# Patient Record
Sex: Female | Born: 1980 | Hispanic: Yes | Marital: Single | Smoking: Never smoker
Health system: Southern US, Community
[De-identification: ages and names within clinical notes are randomized; demographics above are authoritative.]

## PROBLEM LIST (undated history)

## (undated) DIAGNOSIS — N83209 Unspecified ovarian cyst, unspecified side: Secondary | ICD-10-CM

## (undated) DIAGNOSIS — R519 Headache, unspecified: Secondary | ICD-10-CM

## (undated) DIAGNOSIS — R51 Headache: Secondary | ICD-10-CM

---

## 2016-02-28 DIAGNOSIS — M549 Dorsalgia, unspecified: Secondary | ICD-10-CM | POA: Insufficient documentation

## 2016-02-28 DIAGNOSIS — G5691 Unspecified mononeuropathy of right upper limb: Secondary | ICD-10-CM | POA: Insufficient documentation

## 2016-02-28 DIAGNOSIS — Q279 Congenital malformation of peripheral vascular system, unspecified: Secondary | ICD-10-CM | POA: Insufficient documentation

## 2016-05-01 ENCOUNTER — Encounter (HOSPITAL_BASED_OUTPATIENT_CLINIC_OR_DEPARTMENT_OTHER): Payer: Self-pay

## 2016-05-01 ENCOUNTER — Emergency Department (HOSPITAL_BASED_OUTPATIENT_CLINIC_OR_DEPARTMENT_OTHER)
Admission: EM | Admit: 2016-05-01 | Discharge: 2016-05-01 | Disposition: A | Discharge status: Home / Self Care | Payer: Medicaid Other | Attending: Emergency Medicine | Admitting: Emergency Medicine

## 2016-05-01 DIAGNOSIS — M542 Cervicalgia: Secondary | ICD-10-CM | POA: Diagnosis present

## 2016-05-01 HISTORY — DX: Headache: R51

## 2016-05-01 HISTORY — DX: Headache, unspecified: R51.9

## 2016-05-01 HISTORY — DX: Unspecified ovarian cyst, unspecified side: N83.209

## 2016-05-01 MED ORDER — ONDANSETRON 8 MG PO TBDP: 8.0000 mg | ORAL_TABLET | Freq: Three times a day (TID) | ORAL | 0 refills | Status: AC | PRN

## 2016-05-01 MED ORDER — HYDROCODONE-ACETAMINOPHEN 5-325 MG PO TABS: 1.0000 | ORAL_TABLET | Freq: Four times a day (QID) | ORAL | 0 refills | Status: DC | PRN

## 2016-05-01 MED ORDER — HYDROCODONE-ACETAMINOPHEN 5-325 MG PO TABS
1.0000 | ORAL_TABLET | Freq: Once | ORAL | Status: AC
  Administered 2016-05-01: 1 via ORAL
  Filled 2016-05-01: qty 1

## 2016-05-01 MED ORDER — ONDANSETRON 8 MG PO TBDP
8.0000 mg | ORAL_TABLET | Freq: Once | ORAL | Status: AC
  Administered 2016-05-01: 8 mg via ORAL
  Filled 2016-05-01: qty 1

## 2016-05-01 NOTE — ED Triage Notes (Addendum)
Pt c/o pain to the base of her neck that increases with movement, earlier it caused nausea and the pain woke her from sleep.  Pt has a hx of headaches, states that now she is having pressure and burning that radiates to both sides of her neck.

## 2016-05-01 NOTE — ED Notes (Signed)
Placed ice pack on back of patient's neck and gave her a warm blanket while her medication starts working.

## 2016-05-01 NOTE — ED Provider Notes (Addendum)
[facility] Provider Note: Lowella DellJ. Lane Shellie Rogoff, MD, FACEP  CSN: [id number] MRN: [id number] ARRIVAL: 05/01/16 at 0335 ROOM: [facility]   CHIEF COMPLAINT  Neck Pain   HISTORY OF PRESENT ILLNESS  Katie BarretteFelice Payne is a 36 y.o. female that the gradual onset of neck pain beginning about 3 PM yesterday afternoon. She denies trauma or other injury. She describes the pain as a burning pain that begins at the top of her C-spine and radiates down to her trapezius muscles bilaterally and to her left deltoid and left upper back. She had some paresthesias in about the C8 dermatome bilaterally earlier but these have resolved. She denies numbness or weakness presently. She rates her pain as an 8 out of 10. She is able to move her neck but movement of her neck reproduces her pain. She is having difficulty localizing its origin. She has not had a fever or chills. She has had nausea in proportion to the pain she has been having.   Past Medical History:  Diagnosis Date  . Headache   . Ovarian cyst     History reviewed. No pertinent surgical history.  No family history on file.  Social History  Substance Use Topics  . Smoking status: Never Smoker  . Smokeless tobacco: Never Used  . Alcohol use Yes     Comment: occasionally     Prior to Admission medications   Medication Sig Start Date End Date Taking? Authorizing Provider  HYDROcodone-acetaminophen (NORCO) 5-325 MG tablet Take 1-2 tablets by mouth every 6 (six) hours as needed (for pain; may cause constipation). 05/01/16   Dayan Desa, MD  ondansetron (ZOFRAN ODT) 8 MG disintegrating tablet Take 1 tablet (8 mg total) by mouth every 8 (eight) hours as needed for nausea or vomiting. 05/01/16   Paula LibraJohn Mertis Mosher, MD    Allergies Patient has no known allergies.   REVIEW OF SYSTEMS  Negative except as noted here or in the History of Present Illness.   PHYSICAL EXAMINATION  Initial Vital Signs Blood pressure 109/82, pulse 78, temperature 97.9 F (36.6  C), temperature source Oral, resp. rate 18, height 5' 3.5" (1.613 m), weight 130 lb (59 kg), last menstrual period 04/26/2016, SpO2 100 %.  Examination General: Well-developed, well-nourished female in no acute distress; appearance consistent with age of record HENT: normocephalic; atraumatic Eyes: pupils equal, round and reactive to light; extraocular muscles intact Neck: Patient able to move neck but movement reproduces pain; neck movements deliberately slow; bilateral posterior soft tissue tenderness without definite muscle spasm; left upper paraspinal tenderness, bilateral trapezius tenderness and tenderness over the left deltoid; no lymphadenopathy Heart: regular rate and rhythm Lungs: clear to auscultation bilaterally Abdomen: soft; nondistended; nontender; no masses or hepatosplenomegaly; bowel sounds present Extremities: No deformity; full range of motion except shoulders due to pain; pulses normal Neurologic: Awake, alert and oriented; motor function intact in all extremities and symmetric; no facial droop; sensation intact in upper extremities and symmetric Skin: Warm and dry Psychiatric: Flat affect   RESULTS  Summary of this visit's results, reviewed by myself:   EKG Interpretation  Date/Time:    Ventricular Rate:    PR Interval:    QRS Duration:   QT Interval:    QTC Calculation:   R Axis:     Text Interpretation:        Laboratory Studies: No results found for this or any previous visit (from the past 24 hour(s)). Imaging Studies: No results found.  ED COURSE  Nursing notes and initial  vitals signs, including pulse oximetry, reviewed.  Vitals:   05/01/16 0343  BP: 109/82  Pulse: 78  Resp: 18  Temp: 97.9 F (36.6 C)  TempSrc: Oral  SpO2: 100%  Weight: 130 lb (59 kg)  Height: 5' 3.5" (1.613 m)   The patient is not exhibiting any signs or symptoms to suggest meningitis. I believe this is likely musculoskeletal or disc related. Its diffuseness leans more  towards musculoskeletal than a focal disc herniation. She was advised that an MRI may be necessary if symptoms persist or worsen. We will treat her symptomatically in the meantime.  Consultation with the [facility] state controlled substances database reveals the patient has received no narcotic prescriptions in the past year.   PROCEDURES    ED DIAGNOSES     ICD-9-CM ICD-10-CM   1. Neck pain 723.1 M54.2        Paula Libra, MD 05/01/16 1027    Paula Libra, MD 05/01/16 [phone]

## 2016-05-01 NOTE — ED Notes (Signed)
Pt and husband verbalize understanding of dc instructions.  Pt has a second appointment with her ortho doctor on Monday and is also advised to try ice or heat with her medications to help with the pain.  They deny any further needs at this time.

## 2016-05-05 ENCOUNTER — Ambulatory Visit (HOSPITAL_BASED_OUTPATIENT_CLINIC_OR_DEPARTMENT_OTHER)
Admission: RE | Admit: 2016-05-05 | Discharge: 2016-05-05 | Disposition: A | Discharge status: Home / Self Care | Payer: Medicaid Other | Source: Ambulatory Visit | Attending: Family Medicine | Admitting: Family Medicine

## 2016-05-05 ENCOUNTER — Encounter: Payer: Self-pay | Admitting: Family Medicine

## 2016-05-05 ENCOUNTER — Ambulatory Visit (INDEPENDENT_AMBULATORY_CARE_PROVIDER_SITE_OTHER): Payer: Medicaid Other | Admitting: Family Medicine

## 2016-05-05 VITALS — BP 106/74 | HR 77 | Ht 64.0 in | Wt 130.0 lb

## 2016-05-05 DIAGNOSIS — M542 Cervicalgia: Secondary | ICD-10-CM | POA: Diagnosis not present

## 2016-05-05 DIAGNOSIS — M549 Dorsalgia, unspecified: Secondary | ICD-10-CM

## 2016-05-05 MED ORDER — PREDNISONE 10 MG PO TABS: ORAL_TABLET | ORAL | 0 refills | Status: AC

## 2016-05-05 MED ORDER — HYDROCODONE-ACETAMINOPHEN 5-325 MG PO TABS: 1.0000 | ORAL_TABLET | Freq: Four times a day (QID) | ORAL | 0 refills | Status: AC | PRN

## 2016-05-05 NOTE — Patient Instructions (Signed)
Get x-rays downstairs as you leave today (of cervical spine, thoracic spine, chest). We will touch base about next steps (possible CT, MRI, etc) depending on those results. Take prednisone dose pack as directed for 6 days. Hydrocodone as needed for severe pain.

## 2016-05-06 DIAGNOSIS — M542 Cervicalgia: Secondary | ICD-10-CM | POA: Insufficient documentation

## 2016-05-06 NOTE — Assessment & Plan Note (Signed)
Independently reviewed radiographs of chest, thoracic, and cervical spines - no evidence fractures, mass, hilar lymphadenopathy, other concerning abnormalities.  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  She's had progressively worsening symptoms over past year.  She will start with prednisone and we will order cervical spine MRI though discussed may be denied as this is her first visit with us.  Hydrocodone as needed for severe pain.

## 2016-05-06 NOTE — Progress Notes (Signed)
PCP: Christ KickErvin,Alison M, PA  Subjective:   HPI: Patient is a 36 y.o. female here for back pain.  Patient reports she's had worsening upper back pain primarily over past year. Pain starts mainly left side and middle of thoracic area and can get up to 8/10 and sharp. Severity and frequency worsened over past 3 months especially. Worse if sleeping on back. Has had numbness into both arms and 4th and 5th digits, progressed to include both hands. Taking a deep breath can cause a painful pop that she feels anterior to posterior in her chest. Difficulty swallowing at times because gets pain on left side of neck. Also with headaches into left side of head with this pain. No bowel/bladder dysfunction. No nausea, vomiting, cough, diaphoresis (though feels hot at times at home), other medical problems.  Past Medical History:  Diagnosis Date  . Headache   . Ovarian cyst     Current Outpatient Prescriptions on File Prior to Visit  Medication Sig Dispense Refill  . ondansetron (ZOFRAN ODT) 8 MG disintegrating tablet Take 1 tablet (8 mg total) by mouth every 8 (eight) hours as needed for nausea or vomiting. 10 tablet 0   No current facility-administered medications on file prior to visit.     No past surgical history on file.  No Known Allergies  Social History   Social History  . Marital status: Significant Other    Spouse name: N/A  . Number of children: N/A  . Years of education: N/A   Occupational History  . Not on file.   Social History Main Topics  . Smoking status: Never Smoker  . Smokeless tobacco: Never Used  . Alcohol use Yes     Comment: occasionally   . Drug use: Unknown  . Sexual activity: Not on file   Other Topics Concern  . Not on file   Social History Narrative  . No narrative on file    No family history on file.  BP 106/74   Pulse 77   Ht 5\' 4"  (1.626 m)   Wt 130 lb (59 kg)   LMP 04/26/2016   BMI 22.31 kg/m   Review of Systems: See HPI above.    Objective:  Physical Exam:  Gen: NAD, comfortable in exam room  Neck: No gross deformity, swelling, bruising. TTP midline and left thoracic paraspinal region.  No other tenderness. No palpable lymph nodes (anterior, posterior cervical; supraclavicular, epitrochlear) or masses. FROM neck - pain on full flexion and extension. BUE strength 5/5.   Sensation diminished bilateral 5th digits.  Intact elsewhere.   2+ equal reflexes in triceps, biceps, brachioradialis tendons. Negative spurlings.  Chest/Lungs: Symmetric.  CTAB without wheezes rales or rhonchi.  Did have a painful pop when taking one of the deep breaths but difficult to localize.   Assessment & Plan:  1. Neck, chest pain - Independently reviewed radiographs of chest, thoracic, and cervical spines - no evidence fractures, mass, hilar lymphadenopathy, other concerning abnormalities.  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  She's had progressively worsening symptoms over past year.  She will start with prednisone and we will order cervical spine MRI though discussed may be denied as this is her first visit with us.  Hydrocodone as needed for severe pain.

## 2016-05-08 ENCOUNTER — Telehealth: Payer: Self-pay | Admitting: Family Medicine

## 2016-05-08 NOTE — Telephone Encounter (Signed)
Unable to contact patient.    Will try again.

## 2017-01-17 ENCOUNTER — Encounter (HOSPITAL_BASED_OUTPATIENT_CLINIC_OR_DEPARTMENT_OTHER): Payer: Self-pay | Admitting: Emergency Medicine

## 2017-01-17 ENCOUNTER — Emergency Department (HOSPITAL_BASED_OUTPATIENT_CLINIC_OR_DEPARTMENT_OTHER): Payer: Medicaid Other

## 2017-01-17 ENCOUNTER — Emergency Department (HOSPITAL_BASED_OUTPATIENT_CLINIC_OR_DEPARTMENT_OTHER)
Admission: EM | Admit: 2017-01-17 | Discharge: 2017-01-17 | Disposition: A | Discharge status: Home / Self Care | Payer: Medicaid Other | Attending: Emergency Medicine | Admitting: Emergency Medicine

## 2017-01-17 DIAGNOSIS — Z79899 Other long term (current) drug therapy: Secondary | ICD-10-CM | POA: Diagnosis not present

## 2017-01-17 DIAGNOSIS — R0789 Other chest pain: Secondary | ICD-10-CM | POA: Diagnosis present

## 2017-01-17 LAB — BASIC METABOLIC PANEL
Anion gap: 8 (ref 5–15)
BUN: 13 mg/dL (ref 6–20)
CALCIUM: 9.4 mg/dL (ref 8.9–10.3)
CO2: 22 mmol/L (ref 22–32)
CREATININE: 0.66 mg/dL (ref 0.44–1.00)
Chloride: 105 mmol/L (ref 101–111)
GLUCOSE: 89 mg/dL (ref 65–99)
Potassium: 4.1 mmol/L (ref 3.5–5.1)
Sodium: 135 mmol/L (ref 135–145)

## 2017-01-17 LAB — CBC
HCT: 38.4 % (ref 36.0–46.0)
Hemoglobin: 12.8 g/dL (ref 12.0–15.0)
MCH: 30.4 pg (ref 26.0–34.0)
MCHC: 33.3 g/dL (ref 30.0–36.0)
MCV: 91.2 fL (ref 78.0–100.0)
PLATELETS: 262 10*3/uL (ref 150–400)
RBC: 4.21 MIL/uL (ref 3.87–5.11)
RDW: 12.9 % (ref 11.5–15.5)
WBC: 8.9 10*3/uL (ref 4.0–10.5)

## 2017-01-17 LAB — TROPONIN I

## 2017-01-17 MED ORDER — ASPIRIN EC 325 MG PO TBEC
325.0000 mg | DELAYED_RELEASE_TABLET | Freq: Once | ORAL | Status: AC
Start: 2017-01-17 — End: 2017-01-17
  Administered 2017-01-17: 325 mg via ORAL
  Filled 2017-01-17: qty 1

## 2017-01-17 NOTE — ED Provider Notes (Signed)
[facility] EMERGENCY DEPARTMENT Provider Note   CSN: [id number] Arrival date & time: 01/17/17  1329     History   Chief Complaint Chief Complaint  Patient presents with  . Chest Pain    HPI Katie Payne is a 36 y.o. female.  HPI   36 year old female presents today with complaints of chest pain.  Patient reports approximately hour prior to arrival in the ED while driving she developed a squeezing sensation in her left chest.  She notes this comes and goes in waves, not associated with shortness of breath, diaphoresis, radiation symptoms.  Patient denies any history of the same.  She denies any cardiac history, and history of DVT or PE, or any other significant risk factors for either.  She is a non-smoker.  No swelling or edema of the lower extremities.   Past Medical History:  Diagnosis Date  . Headache   . Ovarian cyst     Patient Active Problem List   Diagnosis Date Noted  . Neck pain 05/06/2016  . Back pain 02/28/2016  . 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 No pertinent surgical history.  OB History    No data available       Home Medications    Prior to Admission medications   Medication Sig Start Date End Date Taking? Authorizing Provider  DULoxetine (CYMBALTA) 30 MG capsule TK ONE C PO QD FOR 1 WK THEN INCREASE TO 1 C BID 04/04/16   [provider]  HYDROcodone-acetaminophen (NORCO) 5-325 MG tablet Take 1 tablet by mouth every 6 (six) hours as needed (for pain; may cause constipation). 05/05/16   Hudnall, Azucena FallenShane R, MD  ondansetron (ZOFRAN ODT) 8 MG disintegrating tablet Take 1 tablet (8 mg total) by mouth every 8 (eight) hours as needed for nausea or vomiting. 05/01/16   Molpus, Jonny RuizJohn, MD  predniSONE (DELTASONE) 10 MG tablet 6 tabs po day 1, 5 tabs po day 2, 4 tabs po day 3, 3 tabs po day 4, 2 tabs po day 5, 1 tab po day 6 05/05/16   Hudnall, Azucena FallenShane R, MD    Family History No  family history on file.  Social History Social History  Substance Use Topics  . Smoking status: Never Smoker  . Smokeless tobacco: Never Used  . Alcohol use Yes     Comment: occasionally      Allergies   Patient has no known allergies.   Review of Systems Review of Systems  All other systems reviewed and are negative.    Physical Exam Updated Vital Signs BP 104/72 (BP Location: Left Arm)   Pulse 71   Temp 98.2 F (36.8 C) (Oral)   Resp 12   Ht 5\' 3"  (1.6 m)   Wt 56.7 kg (125 lb)   LMP 12/28/2016   SpO2 100%   BMI 22.14 kg/m   Physical Exam  Constitutional: She is oriented to person, place, and time. She appears well-developed and well-nourished.  HENT:  Head: Normocephalic and atraumatic.  Eyes: Pupils are equal, round, and reactive to light. Conjunctivae are normal. Right eye exhibits no discharge. Left eye exhibits no discharge. No scleral icterus.  Neck: Normal range of motion. No JVD present. No tracheal deviation present.  Cardiovascular: Normal rate, regular rhythm, normal heart sounds and intact distal pulses.  Exam reveals no gallop and no friction rub.   No murmur heard. Pulmonary/Chest: Effort normal and breath sounds normal. No stridor. No respiratory  distress. She has no wheezes. She has no rales. She exhibits no tenderness.  Abdominal: Soft. She exhibits no distension and no mass. There is no tenderness. There is no rebound and no guarding.  Neurological: She is alert and oriented to person, place, and time. Coordination normal.  Psychiatric: She has a normal mood and affect. Her behavior is normal. Judgment and thought content normal.  Nursing note and vitals reviewed.   ED Treatments / Results  Labs (all labs ordered are listed, but only abnormal results are displayed) Labs Reviewed  BASIC METABOLIC PANEL  CBC  TROPONIN I    EKG  EKG Interpretation  Date/Time:  Saturday January 17 2017 13:45:33 EDT Ventricular Rate:  70 PR  Interval:  144 QRS Duration: 72 QT Interval:  374 QTC Calculation: 403 R Axis:   79 Text Interpretation:  Normal sinus rhythm Normal ECG Confirmed by Jacalyn Lefevre [phone]) on 01/17/2017 3:26:09 PM       Radiology Dg Chest 2 View  Result Date: 01/17/2017 CLINICAL DATA:  Acute onset chest pain approximately 20 minutes ago. EXAM: CHEST  2 VIEW COMPARISON:  05/05/2016 FINDINGS: The heart size and mediastinal contours are within normal limits. Both lungs are clear. The visualized skeletal structures are unremarkable. IMPRESSION: Negative.  No active cardiopulmonary disease. Electronically Signed   By: Myles Rosenthal M.D.   On: 01/17/2017 14:29    Procedures Procedures (including critical care time)  Medications Ordered in ED Medications  aspirin EC tablet 325 mg (325 mg Oral Given 01/17/17 1505)     Initial Impression / Assessment and Plan / ED Course  I have reviewed the triage vital signs and the nursing notes.  Pertinent labs & imaging results that were available during my care of the patient were reviewed by me and considered in my medical decision making (see chart for details).      Final Clinical Impressions(s) / ED Diagnoses   Final diagnoses:  Chest wall pain    Labs: I-STAT troponin, BMP, CBC  Imaging: DG Chest 2 View  Consults:  Therapeutics: Aspirin  Discharge Meds:   Assessment/Plan: 36 year old female presents today with complaints of chest pain.  This is coming and going in waves.  I have very low suspicion for ACS, PE, dissection or any other life-threatening etiology in this patient.  She has a low Wells and PERC negative, ACS of 0, otherwise well-appearing.  Patient will follow-up as an outpatient with primary care and cardiology, return precautions given.  She verbalized understanding and agreement to today's plan had no further questions or concerns at time of discharge.    New Prescriptions Discharge Medication List as of 01/17/2017  4:05 PM        Eyvonne Mechanic, PA-C 01/17/17 1626    Jacalyn Lefevre, MD 01/24/17 [phone]

## 2017-01-17 NOTE — ED Notes (Signed)
Patient is A & O x4.  She understands AVS instructions.  No questions or concerns.

## 2017-01-17 NOTE — ED Triage Notes (Signed)
PT presents with c/o chest pain that started about 20 minutes ago pt states pain is intermittent and goes straight thru to her back.

## 2017-01-17 NOTE — Discharge Instructions (Signed)
Please read attached information. If you experience any new or worsening signs or symptoms please return to the emergency room for evaluation. Please follow-up with your primary care provider or specialist as discussed.  °

## 2017-03-17 ENCOUNTER — Other Ambulatory Visit: Payer: Self-pay | Admitting: Family Medicine

## 2017-03-21 ENCOUNTER — Telehealth (HOSPITAL_BASED_OUTPATIENT_CLINIC_OR_DEPARTMENT_OTHER): Payer: Self-pay | Admitting: Emergency Medicine

## 2017-03-21 NOTE — Telephone Encounter (Signed)
Patient called asking for the referral that she was given while she was here on her last visit. Patient given name and phone number

## 2018-02-10 ENCOUNTER — Emergency Department (HOSPITAL_BASED_OUTPATIENT_CLINIC_OR_DEPARTMENT_OTHER): Payer: Medicaid Other

## 2018-02-10 ENCOUNTER — Encounter (HOSPITAL_BASED_OUTPATIENT_CLINIC_OR_DEPARTMENT_OTHER): Payer: Self-pay | Admitting: *Deleted

## 2018-02-10 ENCOUNTER — Other Ambulatory Visit: Payer: Self-pay

## 2018-02-10 ENCOUNTER — Emergency Department (HOSPITAL_BASED_OUTPATIENT_CLINIC_OR_DEPARTMENT_OTHER)
Admission: EM | Admit: 2018-02-10 | Discharge: 2018-02-10 | Disposition: A | Discharge status: Home / Self Care | Payer: Medicaid Other | Attending: Emergency Medicine | Admitting: Emergency Medicine

## 2018-02-10 DIAGNOSIS — Z79899 Other long term (current) drug therapy: Secondary | ICD-10-CM | POA: Insufficient documentation

## 2018-02-10 DIAGNOSIS — M436 Torticollis: Secondary | ICD-10-CM

## 2018-02-10 DIAGNOSIS — M542 Cervicalgia: Secondary | ICD-10-CM | POA: Diagnosis present

## 2018-02-10 MED ORDER — CYCLOBENZAPRINE HCL 5 MG PO TABS: 5.0000 mg | ORAL_TABLET | Freq: Three times a day (TID) | ORAL | 0 refills | Status: AC | PRN

## 2018-02-10 MED ORDER — METHOCARBAMOL 1000 MG/10ML IJ SOLN
1000.0000 mg | Freq: Once | INTRAMUSCULAR | Status: AC
  Administered 2018-02-10: 1000 mg via INTRAMUSCULAR
  Filled 2018-02-10: qty 10

## 2018-02-10 MED ORDER — KETOROLAC TROMETHAMINE 60 MG/2ML IM SOLN
60.0000 mg | Freq: Once | INTRAMUSCULAR | Status: AC
  Administered 2018-02-10: 60 mg via INTRAMUSCULAR
  Filled 2018-02-10: qty 2

## 2018-02-10 MED ORDER — IBUPROFEN 800 MG PO TABS: 800.0000 mg | ORAL_TABLET | Freq: Three times a day (TID) | ORAL | 0 refills | Status: AC | PRN

## 2018-02-10 NOTE — ED Provider Notes (Signed)
[facility] EMERGENCY DEPARTMENT Provider Note   CSN: [id number] Arrival date & time: 02/10/18  0809     History   Chief Complaint Chief Complaint  Patient presents with  . Neck Pain    HPI Katie Payne is a 37 y.o. female.  The history is provided by the patient and a significant other. No language interpreter was used.  Neck Pain     Katie Payne is a 37 y.o. female who presents to the Emergency Department complaining of neck pain. She presents to the emergency department complaining of acute neck pain. Symptoms began just upon waking at 550 this morning. She has a history of neck and back problems with a spasm about three months ago that was similar. She does not get treatment at that time. Pain is throughout her midline cervical and upper thoracic spine and is described as severe and constant in nature. Pain is worse with range of motion. She reports several years of intermittent tingling to her fingers bilaterally as well as several years of sensation of not being fully able to empty her bladder. No new numbness, weakness, difficulty breathing or swallowing, chest pain, shortness of breath. She does have mild nausea. Past Medical History:  Diagnosis Date  . Headache   . Ovarian cyst     Patient Active Problem List   Diagnosis Date Noted  . Neck pain 05/06/2016  . Back pain 02/28/2016  . 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 No pertinent surgical history.   OB History   None      Home Medications    Prior to Admission medications   Medication Sig Start Date End Date Taking? Authorizing Provider  cyclobenzaprine (FLEXERIL) 5 MG tablet Take 1-2 tablets (5-10 mg total) by mouth 3 (three) times daily as needed for muscle spasms. 02/10/18   Tilden Fossaees, Debrah Granderson, MD  DULoxetine (CYMBALTA) 30 MG capsule TK ONE C PO QD FOR 1 WK THEN INCREASE TO 1 C BID 04/04/16   [provider]    HYDROcodone-acetaminophen (NORCO) 5-325 MG tablet Take 1 tablet by mouth every 6 (six) hours as needed (for pain; may cause constipation). 05/05/16   Hudnall, Azucena FallenShane R, MD  ibuprofen (ADVIL,MOTRIN) 800 MG tablet Take 1 tablet (800 mg total) by mouth every 8 (eight) hours as needed. 02/10/18   Tilden Fossaees, Roshini Fulwider, MD  ondansetron (ZOFRAN ODT) 8 MG disintegrating tablet Take 1 tablet (8 mg total) by mouth every 8 (eight) hours as needed for nausea or vomiting. 05/01/16   Molpus, Jonny RuizJohn, MD  predniSONE (DELTASONE) 10 MG tablet 6 tabs po day 1, 5 tabs po day 2, 4 tabs po day 3, 3 tabs po day 4, 2 tabs po day 5, 1 tab po day 6 05/05/16   Hudnall, Azucena FallenShane R, MD    Family History History reviewed. No pertinent family history.  Social History Social History   Tobacco Use  . Smoking status: Never Smoker  . Smokeless tobacco: Never Used  Substance Use Topics  . Alcohol use: Yes    Comment: occasionally   . Drug use: Not on file     Allergies   Patient has no known allergies.   Review of Systems Review of Systems  Musculoskeletal: Positive for neck pain.  All other systems reviewed and are negative.    Physical Exam Updated Vital Signs BP 117/83   Pulse 89   Temp 98.2 F (36.8 C) (Oral)   Resp 18  LMP 01/27/2018   SpO2 100%   Physical Exam  Constitutional: She is oriented to person, place, and time. She appears well-developed and well-nourished.  HENT:  Head: Normocephalic and atraumatic.  Cardiovascular: Normal rate and regular rhythm.  No murmur heard. Pulmonary/Chest: Effort normal and breath sounds normal. No stridor. No respiratory distress.  Abdominal: Soft. There is no tenderness. There is no rebound and no guarding.  Musculoskeletal: She exhibits no edema.  Diffuse tenderness to palpation throughout the cervical and upper thoracic spine without discrete bony step off. There is no palpable muscle spasm. There is paraspinous tenderness throughout the cervical and thoracic spine.  Decreased range of motion in the neck secondary to pain.  Neurological: She is alert and oriented to person, place, and time.  Five out of five strength in all four extremities with sensation to light touch intact in all four extremities.  Skin: Skin is warm and dry.  Psychiatric: She has a normal mood and affect. Her behavior is normal.  Nursing note and vitals reviewed.    ED Treatments / Results  Labs (all labs ordered are listed, but only abnormal results are displayed) Labs Reviewed - No data to display  EKG None  Radiology Dg Cervical Spine Complete  Result Date: 02/10/2018 CLINICAL DATA:  Cervicalgia EXAM: CERVICAL SPINE - COMPLETE 4+ VIEW COMPARISON:  May 05, 2016 FINDINGS: Frontal, lateral, open-mouth odontoid, and bilateral oblique views were obtained. There is no fracture or spondylolisthesis. Prevertebral soft tissues and predental space regions are normal. The disc spaces appear unremarkable. There is no appreciable exit foraminal narrowing on the oblique views. There is reversal of lordotic curvature. Lung apices are clear. IMPRESSION: Reversal lordotic curvature, a finding probably indicative of chronic muscle spasm. No fracture or spondylolisthesis. No evident arthropathy. Electronically Signed   By: Bretta Bang III M.D.   On: 02/10/2018 10:42    Procedures Procedures (including critical care time)  Medications Ordered in ED Medications  ketorolac (TORADOL) injection 60 mg (60 mg Intramuscular Given 02/10/18 1042)  methocarbamol (ROBAXIN) injection 1,000 mg (1,000 mg Intramuscular Given 02/10/18 1042)     Initial Impression / Assessment and Plan / ED Course  I have reviewed the triage vital signs and the nursing notes.  Pertinent labs & imaging results that were available during my care of the patient were reviewed by me and considered in my medical decision making (see chart for details).     Should here for evaluation of acute onset neck pain. She  is neurovascular intact on examination. Presentation is not consistent with retro pharyngeal abscess, epidural abscess, dissection. Following treatment in the emergency department she is feeling slightly improved. Plan to discharge home with home care for torticollis. Discussed outpatient follow-up and return precautions.  Final Clinical Impressions(s) / ED Diagnoses   Final diagnoses:  Torticollis, acute    ED Discharge Orders         Ordered    cyclobenzaprine (FLEXERIL) 5 MG tablet  3 times daily PRN     02/10/18 1104    ibuprofen (ADVIL,MOTRIN) 800 MG tablet  Every 8 hours PRN     02/10/18 1104           Tilden Fossa, MD 02/10/18 1107

## 2018-02-10 NOTE — ED Triage Notes (Signed)
Pt reports reaching over to turn off her alarm at 550 am and felt a pulling in her neck, has had pain since then.

## 2020-01-11 IMAGING — CR DG CERVICAL SPINE COMPLETE 4+V
5 series · 5 of 5 positions shown · non-contrast
Comparison: May 05, 2016

CLINICAL DATA: Cervicalgia

EXAM:
CERVICAL SPINE - COMPLETE 4+ VIEW

[w c-spine lat]
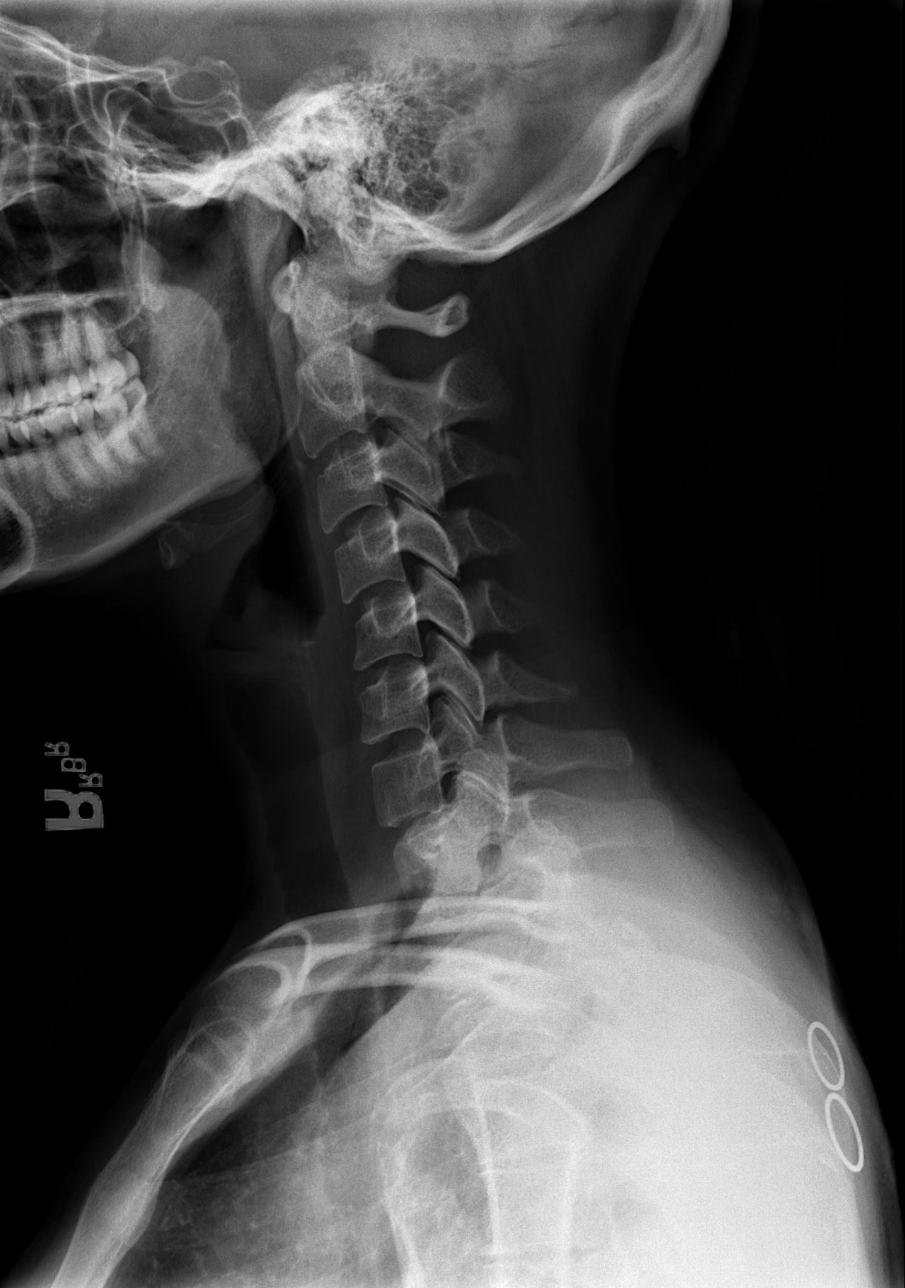

[w c-spine oblique (1 of 2)]
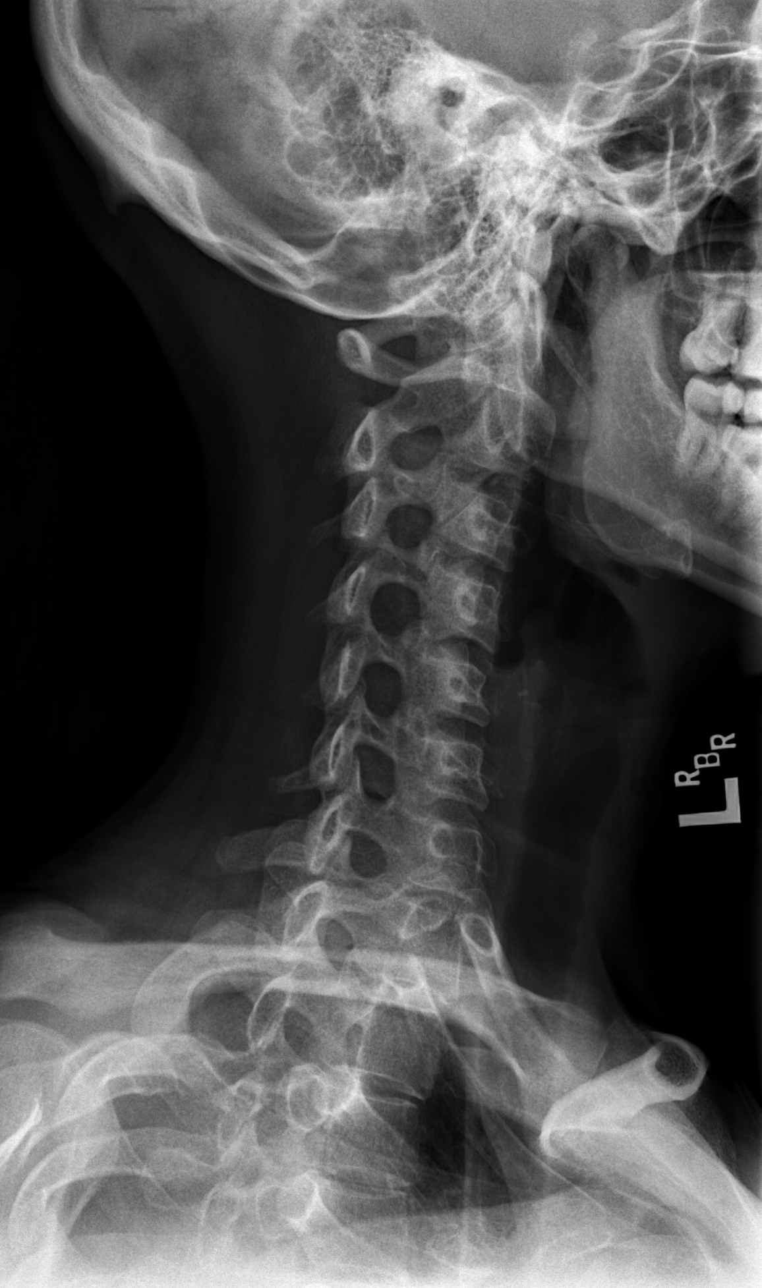

[w c-spine oblique (2 of 2)]
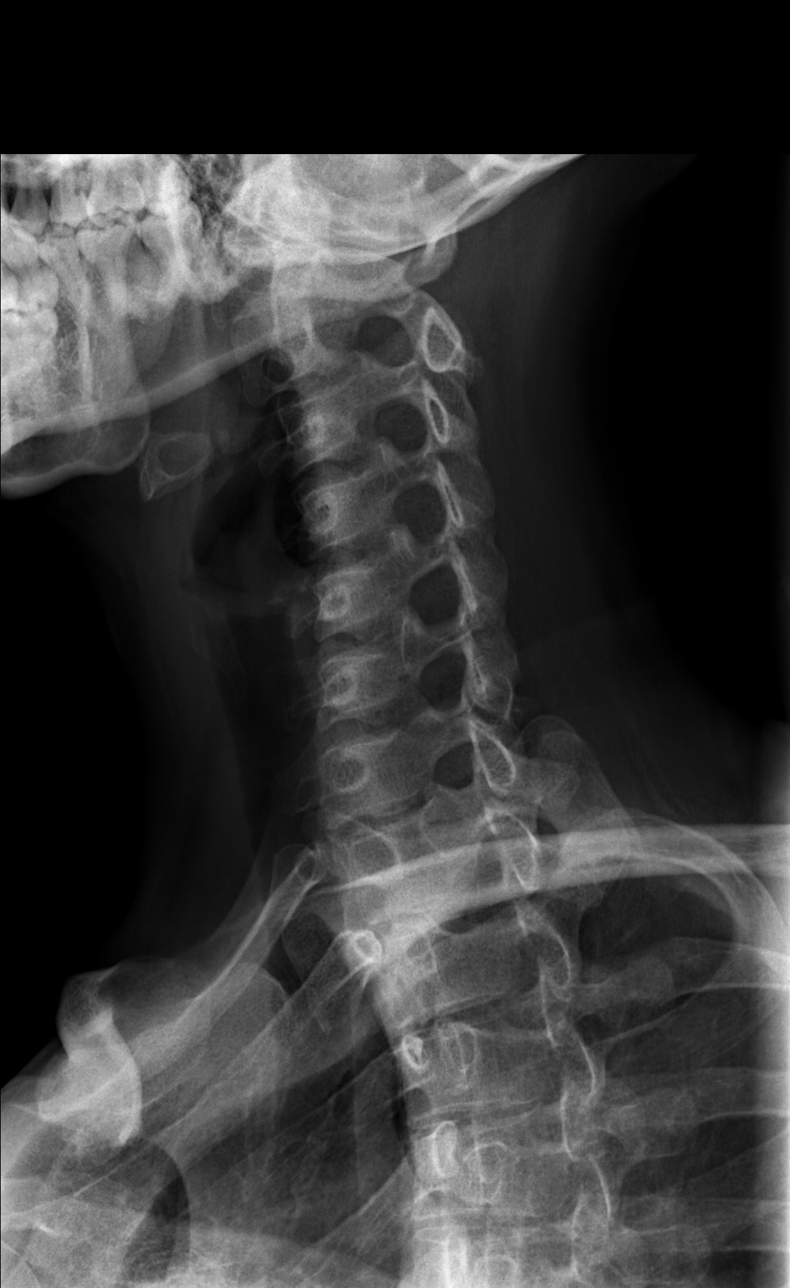

[w c-spine a.p.]
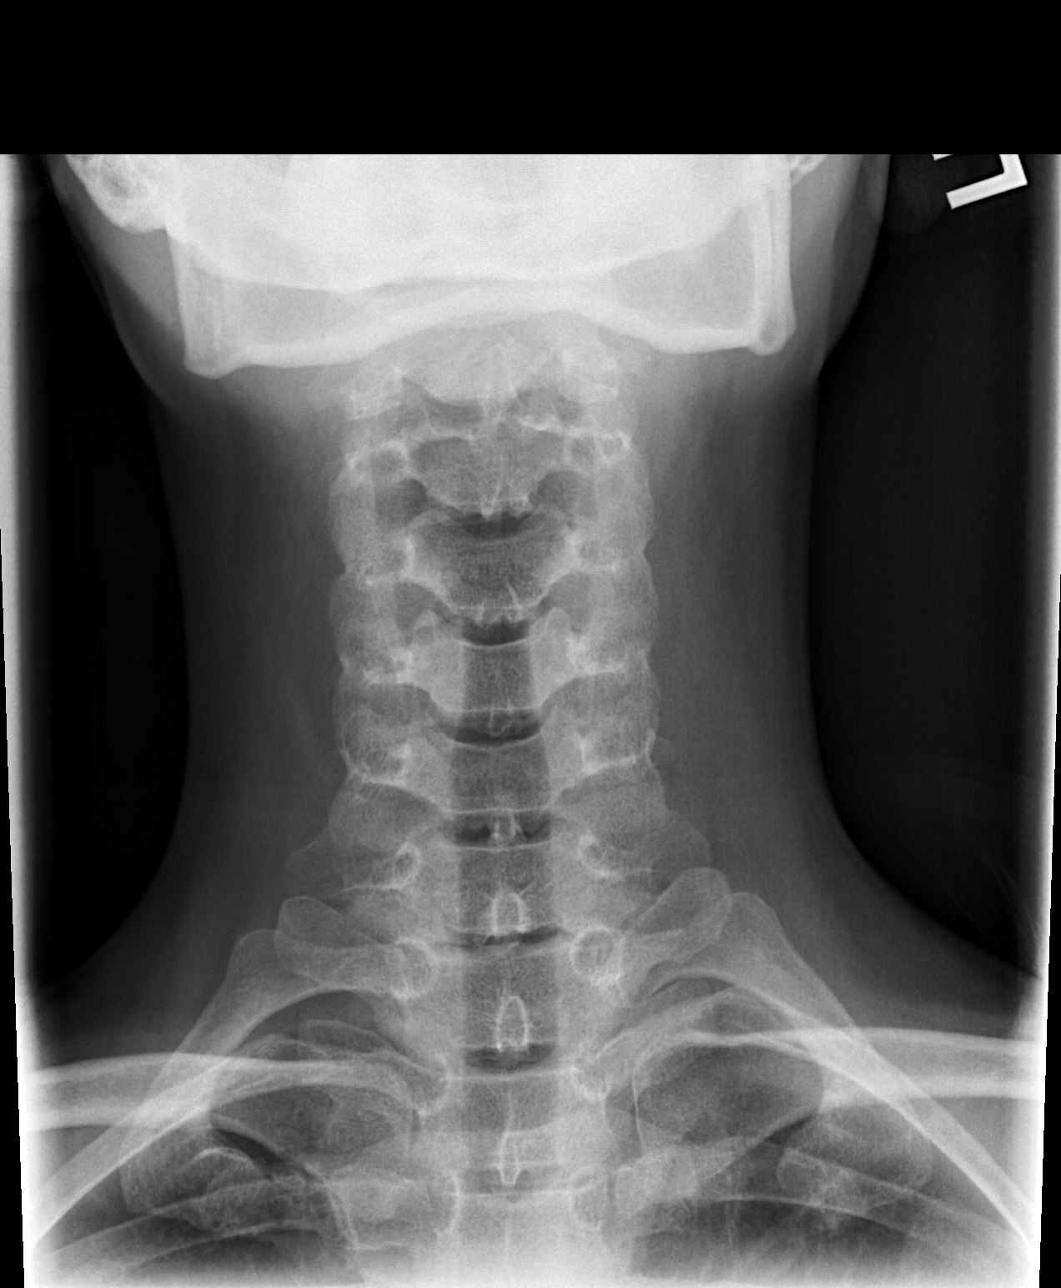

[w c-spine odontoid *]
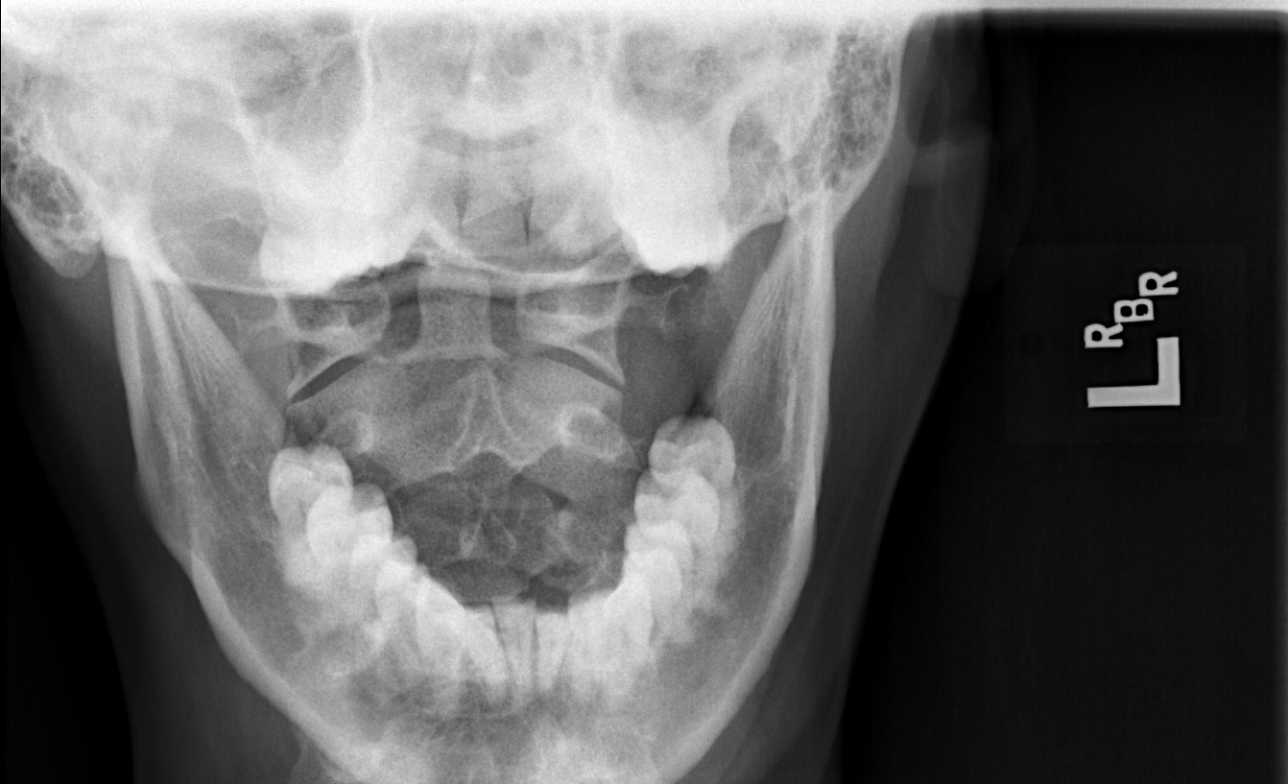

[5 of 5 positions shown; findings below may reference images not displayed]

FINDINGS: Frontal, lateral, open-mouth odontoid, and bilateral oblique views
were obtained. There is no fracture or spondylolisthesis.
Prevertebral soft tissues and predental space regions are normal.
The disc spaces appear unremarkable. There is no appreciable exit
foraminal narrowing on the oblique views. There is reversal of
lordotic curvature. Lung apices are clear.
IMPRESSION: Reversal lordotic curvature, a finding probably indicative of
chronic muscle spasm. No fracture or spondylolisthesis. No evident
arthropathy.

## 2022-10-12 ENCOUNTER — Emergency Department (HOSPITAL_BASED_OUTPATIENT_CLINIC_OR_DEPARTMENT_OTHER)
Admission: EM | Admit: 2022-10-12 | Discharge: 2022-10-13 | Disposition: A | Discharge status: Home / Self Care | Payer: Medicaid Other | Attending: Emergency Medicine | Admitting: Emergency Medicine

## 2022-10-12 ENCOUNTER — Encounter (HOSPITAL_BASED_OUTPATIENT_CLINIC_OR_DEPARTMENT_OTHER): Payer: Self-pay

## 2022-10-12 ENCOUNTER — Other Ambulatory Visit: Payer: Self-pay

## 2022-10-12 DIAGNOSIS — M79671 Pain in right foot: Secondary | ICD-10-CM | POA: Diagnosis not present

## 2022-10-12 DIAGNOSIS — M79652 Pain in left thigh: Secondary | ICD-10-CM | POA: Diagnosis not present

## 2022-10-12 DIAGNOSIS — M79662 Pain in left lower leg: Secondary | ICD-10-CM | POA: Diagnosis not present

## 2022-10-12 DIAGNOSIS — M79605 Pain in left leg: Secondary | ICD-10-CM

## 2022-10-12 LAB — BASIC METABOLIC PANEL
Anion gap: 8 (ref 5–15)
BUN: 14 mg/dL (ref 6–20)
CO2: 21 mmol/L — ABNORMAL LOW (ref 22–32)
Calcium: 8.7 mg/dL — ABNORMAL LOW (ref 8.9–10.3)
Chloride: 105 mmol/L (ref 98–111)
Creatinine, Ser: 0.84 mg/dL (ref 0.44–1.00)
GFR, Estimated: >60 mL/min (ref >60)
Glucose, Bld: 105 mg/dL — ABNORMAL HIGH (ref 70–99)
Potassium: 3.9 mmol/L (ref 3.5–5.1)
Sodium: 134 mmol/L — ABNORMAL LOW (ref 135–145)

## 2022-10-12 LAB — CBC WITH DIFFERENTIAL/PLATELET
Abs Immature Granulocytes: 0.01 10*3/uL (ref 0.00–0.07)
Basophils Absolute: 0 10*3/uL (ref 0.0–0.1)
Basophils Relative: 0 %
Eosinophils Absolute: 0.2 10*3/uL (ref 0.0–0.5)
Eosinophils Relative: 2 %
HCT: 36.6 % (ref 36.0–46.0)
Hemoglobin: 12.5 g/dL (ref 12.0–15.0)
Immature Granulocytes: 0 %
Lymphocytes Relative: 37 %
Lymphs Abs: 3.1 10*3/uL (ref 0.7–4.0)
MCH: 31.9 pg (ref 26.0–34.0)
MCHC: 34.2 g/dL (ref 30.0–36.0)
MCV: 93.4 fL (ref 80.0–100.0)
Monocytes Absolute: 0.5 10*3/uL (ref 0.1–1.0)
Monocytes Relative: 6 %
Neutro Abs: 4.5 10*3/uL (ref 1.7–7.7)
Neutrophils Relative %: 55 %
Platelets: 255 10*3/uL (ref 150–400)
RBC: 3.92 MIL/uL (ref 3.87–5.11)
RDW: 13.1 % (ref 11.5–15.5)
WBC: 8.3 10*3/uL (ref 4.0–10.5)
nRBC: 0 % (ref 0.0–0.2)

## 2022-10-12 LAB — PREGNANCY, URINE: Preg Test, Ur: NEGATIVE

## 2022-10-12 MED ORDER — ENOXAPARIN SODIUM 60 MG/0.6ML IJ SOSY
1.0000 mg/kg | PREFILLED_SYRINGE | Freq: Once | INTRAMUSCULAR | Status: AC
  Administered 2022-10-13: 52.5 mg via SUBCUTANEOUS
  Filled 2022-10-12: qty 0.6

## 2022-10-12 NOTE — ED Provider Notes (Signed)
[facility] Provider Note   CSN: [id number] Arrival date & time: 10/12/22  2246     History  Chief Complaint  Patient presents with   Leg Pain    Katie Payne is a 42 y.o. female.  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  Patient presenting today with complaints of pain in the left calf extending to the left thigh.  This has been present for the past day.  2 days ago, she underwent an arteriogram of her right leg with entry point in the left groin.  Pain started the day after this procedure.  She denies any chest pain or difficulty breathing.  She denies any swelling of the leg.  No injury or trauma.  The history is provided by the patient.       Home Medications Prior to Admission medications   Medication Sig Start Date End Date Taking? Authorizing Provider  cyclobenzaprine (FLEXERIL) 5 MG tablet Take 1-2 tablets (5-10 mg total) by mouth 3 (three) times daily as needed for muscle spasms. 02/10/18   Tilden Fossa, MD  DULoxetine (CYMBALTA) 30 MG capsule TK ONE C PO QD FOR 1 WK THEN INCREASE TO 1 C BID 04/04/16   [provider]  HYDROcodone-acetaminophen (NORCO) 5-325 MG tablet Take 1 tablet by mouth every 6 (six) hours as needed (for pain; may cause constipation). 05/05/16   Hudnall, Azucena Fallen, MD  ibuprofen (ADVIL,MOTRIN) 800 MG tablet Take 1 tablet (800 mg total) by mouth every 8 (eight) hours as needed. 02/10/18   Tilden Fossa, MD  ondansetron (ZOFRAN ODT) 8 MG disintegrating tablet Take 1 tablet (8 mg total) by mouth every 8 (eight) hours as needed for nausea or vomiting. 05/01/16   Molpus, Jonny Ruiz, MD  predniSONE (DELTASONE) 10 MG tablet 6 tabs po day 1, 5 tabs po day 2, 4 tabs po day 3, 3 tabs po day 4, 2 tabs po day 5, 1 tab po day 6 05/05/16   Hudnall, Azucena Fallen, MD      Allergies    Patient has no known allergies.    Review of Systems   Review of Systems  All other  systems reviewed and are negative.   Physical Exam Updated Vital Signs BP 115/81   Pulse 67   Temp 97.7 F (36.5 C)   Resp 18   Wt 52.2 kg   SpO2 96%   BMI 20.37 kg/m  Physical Exam Vitals and nursing note reviewed.  Constitutional:      Appearance: Normal appearance.  Pulmonary:     Effort: Pulmonary effort is normal.  Musculoskeletal:     Comments: The left lower extremity is grossly normal in appearance.  There is no swelling or edema.  There is no calf tenderness.  Denna Haggard' sign is absent.  DP pulses are palpable.  Skin:    General: Skin is warm and dry.  Neurological:     Mental Status: She is alert and oriented to person, place, and time.     ED Results / Procedures / Treatments   Labs (all labs ordered are listed, but only abnormal results are displayed) Labs Reviewed  BASIC METABOLIC PANEL - Abnormal; Notable for the following components:      Result Value   Sodium 134 (*)    CO2 21 (*)    Glucose, Bld 105 (*)    Calcium 8.7 (*)    All other components within normal  limits  CBC WITH DIFFERENTIAL/PLATELET  PREGNANCY, URINE    EKG None  Radiology No results found.  Procedures Procedures    Medications Ordered in ED Medications  enoxaparin (LOVENOX) injection 52.5 mg (has no administration in time range)    ED Course/ Medical Decision Making/ A&P  Patient presenting with complaints of pain in the left calf and thigh as described in the HPI.  This started the day after undergoing an arteriogram of the right leg.  For the procedure, they entered through the left groin.  Patient arrives here afebrile with stable vital signs.  Physical examination of the leg is basically unremarkable.  I see and palpate no abnormalities.  The insertion site appears to be healing well.  There is no swelling or surrounding erythema.  Laboratory studies including CBC, metabolic panel, and pregnancy test all unremarkable.  As there is no ultrasound present at night at this  facility, patient will undergo an ultrasound in the morning.  I have elected to administer Lovenox to bridge the gap until the study has been completed.  Final Clinical Impression(s) / ED Diagnoses Final diagnoses:  None    Rx / DC Orders ED Discharge Orders     None         Geoffery Lyons, MD 10/13/22 0000

## 2022-10-12 NOTE — ED Triage Notes (Signed)
Pt arrives with c/o left leg pain that started today. Per pt, she had an arteriogram performed Friday for the right leg and they entered in her left groin to do the procedure. Pt describes pain as a shooting pain from mid calf to back of knee. Pt denies CP or SOB.

## 2022-10-13 ENCOUNTER — Ambulatory Visit (HOSPITAL_BASED_OUTPATIENT_CLINIC_OR_DEPARTMENT_OTHER)
Admission: RE | Admit: 2022-10-13 | Discharge: 2022-10-13 | Disposition: A | Discharge status: Home / Self Care | Payer: Medicaid Other | Source: Ambulatory Visit | Attending: Emergency Medicine | Admitting: Emergency Medicine

## 2022-10-13 ENCOUNTER — Other Ambulatory Visit (HOSPITAL_BASED_OUTPATIENT_CLINIC_OR_DEPARTMENT_OTHER): Payer: Self-pay | Admitting: Emergency Medicine

## 2022-10-13 DIAGNOSIS — M79605 Pain in left leg: Secondary | ICD-10-CM | POA: Diagnosis not present

## 2022-10-13 NOTE — ED Notes (Signed)
Discharge paperwork reviewed entirely with patient, including follow up care. Pain was under control. The patient received instruction and coaching on their prescriptions, and all follow-up questions were answered.  Pt verbalized understanding as well as all parties involved. No questions or concerns voiced at the time of discharge. No acute distress noted.   Pt ambulated out to PVA without incident or assistance.  

## 2022-10-13 NOTE — ED Provider Notes (Signed)
This patient returns for DVT study of the left leg after she had an angiogram performed at [facility] left leg pain.  I gave the results of the ultrasound which showed no evidence of DVT.  Patient reports that her pain is still somewhat present but overall improving.  Encouraged to follow-up with [facility] options.  Patient understands and agrees to this plan.   Olene Floss, PA-C 10/13/22 1303    Melene Plan, DO 10/14/22 [phone]

## 2022-10-13 NOTE — Discharge Instructions (Signed)
Return tomorrow at the given time for an ultrasound of your leg to rule out a blood clot.  If this study is negative, take ibuprofen 600 mg every 6 hours as needed for pain and rest.  If study is positive, you will be started on blood thinners.  Follow-up with primary doctor/vascular surgeon.

## 2022-10-13 NOTE — ED Notes (Signed)
[place] scheduled for 7/15 at 1200.  Pt instructed to return at that time for outpatient study.  Pt verbalized understanding
